# Patient Record
Sex: Male | Born: 1945 | Race: White | Hispanic: No | Smoking: Former smoker
Health system: Southern US, Community
[De-identification: ages and names within clinical notes are randomized; demographics above are authoritative.]

## PROBLEM LIST (undated history)

## (undated) DIAGNOSIS — K219 Gastro-esophageal reflux disease without esophagitis: Secondary | ICD-10-CM

## (undated) DIAGNOSIS — I480 Paroxysmal atrial fibrillation: Secondary | ICD-10-CM

## (undated) DIAGNOSIS — J449 Chronic obstructive pulmonary disease, unspecified: Secondary | ICD-10-CM

## (undated) DIAGNOSIS — E1140 Type 2 diabetes mellitus with diabetic neuropathy, unspecified: Secondary | ICD-10-CM

## (undated) DIAGNOSIS — N183 Chronic kidney disease, stage 3 unspecified: Secondary | ICD-10-CM

## (undated) DIAGNOSIS — D696 Thrombocytopenia, unspecified: Secondary | ICD-10-CM

## (undated) DIAGNOSIS — N179 Acute kidney failure, unspecified: Secondary | ICD-10-CM

## (undated) DIAGNOSIS — M109 Gout, unspecified: Secondary | ICD-10-CM

## (undated) DIAGNOSIS — J90 Pleural effusion, not elsewhere classified: Secondary | ICD-10-CM

## (undated) DIAGNOSIS — E119 Type 2 diabetes mellitus without complications: Secondary | ICD-10-CM

## (undated) DIAGNOSIS — I2781 Cor pulmonale (chronic): Secondary | ICD-10-CM

## (undated) DIAGNOSIS — I2721 Secondary pulmonary arterial hypertension: Secondary | ICD-10-CM

## (undated) DIAGNOSIS — K766 Portal hypertension: Secondary | ICD-10-CM

## (undated) DIAGNOSIS — I10 Essential (primary) hypertension: Secondary | ICD-10-CM

---

## 2016-01-06 ENCOUNTER — Inpatient Hospital Stay
Admission: AD | Admit: 2016-01-06 | Discharge: 2016-01-24 | Disposition: A | Discharge status: Home Health | Payer: Self-pay | Source: Ambulatory Visit | Attending: Internal Medicine | Admitting: Internal Medicine

## 2016-01-06 DIAGNOSIS — J90 Pleural effusion, not elsewhere classified: Secondary | ICD-10-CM

## 2016-01-06 DIAGNOSIS — J9690 Respiratory failure, unspecified, unspecified whether with hypoxia or hypercapnia: Secondary | ICD-10-CM

## 2016-01-06 HISTORY — DX: Pleural effusion, not elsewhere classified: J90

## 2016-01-06 HISTORY — DX: Chronic obstructive pulmonary disease, unspecified: J44.9

## 2016-01-06 HISTORY — DX: Type 2 diabetes mellitus without complications: E11.9

## 2016-01-06 HISTORY — DX: Portal hypertension: K76.6

## 2016-01-06 HISTORY — DX: Morbid (severe) obesity due to excess calories: E66.01

## 2016-01-06 HISTORY — DX: Acute kidney failure, unspecified: N17.9

## 2016-01-06 HISTORY — DX: Secondary pulmonary arterial hypertension: I27.21

## 2016-01-06 HISTORY — DX: Gastro-esophageal reflux disease without esophagitis: K21.9

## 2016-01-06 HISTORY — DX: Type 2 diabetes mellitus with diabetic neuropathy, unspecified: E11.40

## 2016-01-06 HISTORY — DX: Chronic kidney disease, stage 3 (moderate): N18.3

## 2016-01-06 HISTORY — DX: Cor pulmonale (chronic): I27.81

## 2016-01-06 HISTORY — DX: Paroxysmal atrial fibrillation: I48.0

## 2016-01-06 HISTORY — DX: Essential (primary) hypertension: I10

## 2016-01-06 HISTORY — DX: Gout, unspecified: M10.9

## 2016-01-06 HISTORY — DX: Chronic kidney disease, stage 3 unspecified: N18.30

## 2016-01-06 HISTORY — DX: Thrombocytopenia, unspecified: D69.6

## 2016-01-07 ENCOUNTER — Institutional Professional Consult (permissible substitution) (HOSPITAL_COMMUNITY): Payer: Self-pay

## 2016-01-07 LAB — CBC WITH DIFFERENTIAL/PLATELET
BASOS ABS: 0 10*3/uL (ref 0.0–0.1)
Basophils Relative: 0 %
EOS PCT: 2 %
Eosinophils Absolute: 0.1 10*3/uL (ref 0.0–0.7)
HEMATOCRIT: 41.9 % (ref 39.0–52.0)
Hemoglobin: 12.4 g/dL — ABNORMAL LOW (ref 13.0–17.0)
LYMPHS PCT: 21 %
Lymphs Abs: 1.2 10*3/uL (ref 0.7–4.0)
MCH: 28.8 pg (ref 26.0–34.0)
MCHC: 29.6 g/dL — AB (ref 30.0–36.0)
MCV: 97.2 fL (ref 78.0–100.0)
MONO ABS: 0.3 10*3/uL (ref 0.1–1.0)
MONOS PCT: 5 %
NEUTROS ABS: 4.2 10*3/uL (ref 1.7–7.7)
Neutrophils Relative %: 73 %
PLATELETS: 87 10*3/uL — AB (ref 150–400)
RBC: 4.31 MIL/uL (ref 4.22–5.81)
RDW: 16.2 % — AB (ref 11.5–15.5)
WBC: 5.8 10*3/uL (ref 4.0–10.5)

## 2016-01-07 LAB — COMPREHENSIVE METABOLIC PANEL
ALBUMIN: 2.9 g/dL — AB (ref 3.5–5.0)
ALK PHOS: 74 U/L (ref 38–126)
ALT: 24 U/L (ref 17–63)
ANION GAP: 5 (ref 5–15)
AST: 15 U/L (ref 15–41)
BUN: 19 mg/dL (ref 6–20)
CALCIUM: 9.4 mg/dL (ref 8.9–10.3)
CO2: 40 mmol/L — AB (ref 22–32)
Chloride: 95 mmol/L — ABNORMAL LOW (ref 101–111)
Creatinine, Ser: 1.18 mg/dL (ref 0.61–1.24)
GFR calc Af Amer: >60 mL/min (ref >60)
GFR calc non Af Amer: >60 mL/min (ref >60)
GLUCOSE: 115 mg/dL — AB (ref 65–99)
POTASSIUM: 5 mmol/L (ref 3.5–5.1)
SODIUM: 140 mmol/L (ref 135–145)
TOTAL PROTEIN: 5.1 g/dL — AB (ref 6.5–8.1)
Total Bilirubin: 1.2 mg/dL (ref 0.3–1.2)

## 2016-01-07 LAB — PROTIME-INR
INR: 1.3 (ref 0.00–1.49)
Prothrombin Time: 16.3 seconds — ABNORMAL HIGH (ref 11.6–15.2)

## 2016-01-07 LAB — MAGNESIUM: Magnesium: 1.9 mg/dL (ref 1.7–2.4)

## 2016-01-07 LAB — PROCALCITONIN: PROCALCITONIN: 0.21 ng/mL

## 2016-01-07 LAB — BRAIN NATRIURETIC PEPTIDE: B Natriuretic Peptide: 220.4 pg/mL — ABNORMAL HIGH (ref 0.0–100.0)

## 2016-01-07 LAB — TSH: TSH: 3.016 u[IU]/mL (ref 0.350–4.500)

## 2016-01-07 LAB — T4, FREE: Free T4: 1.11 ng/dL (ref 0.61–1.12)

## 2016-01-07 LAB — PHOSPHORUS: Phosphorus: 2.8 mg/dL (ref 2.5–4.6)

## 2016-01-08 LAB — CBC
HEMATOCRIT: 41 % (ref 39.0–52.0)
Hemoglobin: 11.9 g/dL — ABNORMAL LOW (ref 13.0–17.0)
MCH: 28.2 pg (ref 26.0–34.0)
MCHC: 29 g/dL — ABNORMAL LOW (ref 30.0–36.0)
MCV: 97.2 fL (ref 78.0–100.0)
PLATELETS: 95 10*3/uL — AB (ref 150–400)
RBC: 4.22 MIL/uL (ref 4.22–5.81)
RDW: 16.2 % — ABNORMAL HIGH (ref 11.5–15.5)
WBC: 7.9 10*3/uL (ref 4.0–10.5)

## 2016-01-08 LAB — RENAL FUNCTION PANEL
Albumin: 2.8 g/dL — ABNORMAL LOW (ref 3.5–5.0)
Anion gap: 6 (ref 5–15)
BUN: 17 mg/dL (ref 6–20)
CALCIUM: 9.5 mg/dL (ref 8.9–10.3)
CO2: 38 mmol/L — AB (ref 22–32)
CREATININE: 1.14 mg/dL (ref 0.61–1.24)
Chloride: 97 mmol/L — ABNORMAL LOW (ref 101–111)
Glucose, Bld: 112 mg/dL — ABNORMAL HIGH (ref 65–99)
Phosphorus: 3.5 mg/dL (ref 2.5–4.6)
Potassium: 4.8 mmol/L (ref 3.5–5.1)
SODIUM: 141 mmol/L (ref 135–145)

## 2016-01-08 LAB — MAGNESIUM: Magnesium: 1.9 mg/dL (ref 1.7–2.4)

## 2016-01-09 ENCOUNTER — Other Ambulatory Visit (HOSPITAL_COMMUNITY): Payer: Self-pay

## 2016-01-09 LAB — HEMOGLOBIN A1C
HEMOGLOBIN A1C: 7.2 % — AB (ref 4.8–5.6)
MEAN PLASMA GLUCOSE: 160 mg/dL

## 2016-01-10 LAB — RENAL FUNCTION PANEL
Albumin: 3.3 g/dL — ABNORMAL LOW (ref 3.5–5.0)
Anion gap: 8 (ref 5–15)
BUN: 21 mg/dL — AB (ref 6–20)
CALCIUM: 9.8 mg/dL (ref 8.9–10.3)
CO2: 34 mmol/L — AB (ref 22–32)
CREATININE: 1.4 mg/dL — AB (ref 0.61–1.24)
Chloride: 95 mmol/L — ABNORMAL LOW (ref 101–111)
GFR calc Af Amer: 58 mL/min — ABNORMAL LOW (ref >60)
GFR calc non Af Amer: 50 mL/min — ABNORMAL LOW (ref >60)
GLUCOSE: 149 mg/dL — AB (ref 65–99)
Phosphorus: 4.3 mg/dL (ref 2.5–4.6)
Potassium: 4.7 mmol/L (ref 3.5–5.1)
SODIUM: 137 mmol/L (ref 135–145)

## 2016-01-10 LAB — CBC WITH DIFFERENTIAL/PLATELET
BASOS PCT: 0 %
Basophils Absolute: 0 10*3/uL (ref 0.0–0.1)
EOS ABS: 0.1 10*3/uL (ref 0.0–0.7)
Eosinophils Relative: 1 %
HCT: 44.5 % (ref 39.0–52.0)
HEMOGLOBIN: 12.9 g/dL — AB (ref 13.0–17.0)
Lymphocytes Relative: 12 %
Lymphs Abs: 0.9 10*3/uL (ref 0.7–4.0)
MCH: 28.3 pg (ref 26.0–34.0)
MCHC: 29 g/dL — AB (ref 30.0–36.0)
MCV: 97.6 fL (ref 78.0–100.0)
MONO ABS: 0.4 10*3/uL (ref 0.1–1.0)
MONOS PCT: 6 %
NEUTROS PCT: 81 %
Neutro Abs: 5.7 10*3/uL (ref 1.7–7.7)
Platelets: 88 10*3/uL — ABNORMAL LOW (ref 150–400)
RBC: 4.56 MIL/uL (ref 4.22–5.81)
RDW: 16.1 % — AB (ref 11.5–15.5)
WBC: 7.1 10*3/uL (ref 4.0–10.5)

## 2016-01-10 LAB — MAGNESIUM: MAGNESIUM: 1.9 mg/dL (ref 1.7–2.4)

## 2016-01-11 LAB — CBC WITH DIFFERENTIAL/PLATELET
BASOS ABS: 0 10*3/uL (ref 0.0–0.1)
Basophils Relative: 0 %
EOS ABS: 0.1 10*3/uL (ref 0.0–0.7)
Eosinophils Relative: 2 %
HEMATOCRIT: 42.1 % (ref 39.0–52.0)
HEMOGLOBIN: 12.8 g/dL — AB (ref 13.0–17.0)
LYMPHS ABS: 0.9 10*3/uL (ref 0.7–4.0)
Lymphocytes Relative: 14 %
MCH: 29.2 pg (ref 26.0–34.0)
MCHC: 30.4 g/dL (ref 30.0–36.0)
MCV: 96.1 fL (ref 78.0–100.0)
MONO ABS: 0.4 10*3/uL (ref 0.1–1.0)
Monocytes Relative: 7 %
NEUTROS ABS: 5 10*3/uL (ref 1.7–7.7)
Neutrophils Relative %: 77 %
Platelets: 113 10*3/uL — ABNORMAL LOW (ref 150–400)
RBC: 4.38 MIL/uL (ref 4.22–5.81)
RDW: 16 % — ABNORMAL HIGH (ref 11.5–15.5)
WBC: 6.4 10*3/uL (ref 4.0–10.5)

## 2016-01-11 LAB — RENAL FUNCTION PANEL
ALBUMIN: 3.2 g/dL — AB (ref 3.5–5.0)
ANION GAP: 6 (ref 5–15)
BUN: 21 mg/dL — AB (ref 6–20)
CALCIUM: 9.6 mg/dL (ref 8.9–10.3)
CO2: 37 mmol/L — ABNORMAL HIGH (ref 22–32)
Chloride: 94 mmol/L — ABNORMAL LOW (ref 101–111)
Creatinine, Ser: 1.26 mg/dL — ABNORMAL HIGH (ref 0.61–1.24)
GFR calc Af Amer: >60 mL/min (ref >60)
GFR, EST NON AFRICAN AMERICAN: 57 mL/min — AB (ref >60)
GLUCOSE: 145 mg/dL — AB (ref 65–99)
PHOSPHORUS: 3.9 mg/dL (ref 2.5–4.6)
POTASSIUM: 5.1 mmol/L (ref 3.5–5.1)
SODIUM: 137 mmol/L (ref 135–145)

## 2016-01-11 LAB — MAGNESIUM: MAGNESIUM: 1.9 mg/dL (ref 1.7–2.4)

## 2016-01-12 ENCOUNTER — Encounter: Payer: Self-pay | Admitting: Nurse Practitioner

## 2016-01-12 DIAGNOSIS — I480 Paroxysmal atrial fibrillation: Secondary | ICD-10-CM

## 2016-01-12 DIAGNOSIS — Z7901 Long term (current) use of anticoagulants: Secondary | ICD-10-CM

## 2016-01-12 DIAGNOSIS — J438 Other emphysema: Secondary | ICD-10-CM

## 2016-01-12 NOTE — Consult Note (Signed)
Cardiology Consult    Patient ID: Walter Martin MRN: [id number], DOB/AGE: 69-Jun-1947   Admit date: 01/06/2016 Date of Consult: 01/12/2016  Primary Physician: Pcp Not In System Primary Cardiologist: new - seen by M. Anne Fu, MD  Requesting Provider: A. Hijazi, MD  Patient Profile    70 y/o ? with a h/o COPD, HTN, HL, DM2, and chronic resp failure, whom we've been asked to eval 2/2 PAF.  Past Medical History   Past Medical History  Diagnosis Date  . COPD (chronic obstructive pulmonary disease) (HCC)   . PAF (paroxysmal atrial fibrillation) (HCC)     a. Dx 01/2016;  b. CHA2DS2VASc = 3-->Eliquis.  . Acute kidney injury (HCC)     a. 12/2015 in setting of resp failure/poor PO intake.  Marland Kitchen Pleural effusion, right     a. 12/2015 s/p thoracentesis [facility]).  . Essential hypertension   . Cor pulmonale (chronic) (HCC)   . PAH (pulmonary arterial hypertension) with portal hypertension (HCC)     a. 12/2015 Echo [facility]): EF 60%, mod PAH.  Marland Kitchen Thrombocytopenia (HCC)   . Type II diabetes mellitus (HCC)   . GERD (gastroesophageal reflux disease)   . Diabetic neuropathy (HCC)   . Gout   . Morbid obesity (HCC)   . CKD (chronic kidney disease), stage III     No past surgical history on file.   Allergies  Allergies not on file  History of Present Illness    70 year old male with above complex past medical history. Is a long history of tobacco abuse, smoking up to 2 packs a day for approximately 40 years. He also has a history of hypertension, hyperlipidemia, diabetes with neuropathy, stage III chronic kidney disease, obesity, thrombocytopenia, and COPD. He lives by himself in [facility] and is not particularly active. He was admitted to [facility] earlier this spring with COPD requiring steroid taper and adjustment of inhalers. More recently, [facility] hypoxic and hypercarbic respiratory failure in the setting of an acute exacerbation of COPD requiring  BiPAP. He was subsequently transferred to [facility] on the 20th he was treated with antibiotics, steroids, and inhalers. Chest x-ray showed a right-sided pleural effusion with atelectasis and subsequently underwent thoracentesis with removal of 700 mL on May 30. Fluid was found to be transudate without malignancy. Echocardiogram was performed during admission and reportedly showed normal LV function with evidence of pulmonary arterial hypertension and elevated right atrial pressure.  Following thoracentesis, his respiratory status steadily improved though he continued to require oxygen via nasal cannula. On June 2, he went into atrial fibrillation with rapid ventricular response. He was initially treated with IV metoprolol with subsequent conversion to sinus rhythm. He was placed on Elavil was 5 mg twice a day and also oral diltiazem. [facility] Patient has been relatively stable since his admission here and says that his breathing has continued to improve and he continues to have a mild, productive cough. He has not been having any chest pain or palpitations. Beginning in the early morning hours of June 5, he had recurrent atrial fibrillation. This was initially treated with IV diltiazem and subsequently changed to amiodarone infusion on the morning of June 7. His rates have been in the 90s to 130s and he has not been experiencing any chest pain, palpitations, or dyspnea. We have been asked to evaluate.electrolytes and thyroid function have been within normal limits.  Inpatient Medications  Amiodarone at 0.5 mg per minute Humalog a.c. And at bedtime Aspirin 81 mg daily Lipitor 20 mg daily Budesonide 0.5 mg twice a day Daliresp 500 mcg daily Eliquis 5 mg twice a day Flonase 50 mcg twice a day Gabapentin 300 mg 3 times a day Guaifenesin DM 100-10 mg per 5 mL of every 6 hours Incruse Ellipta 62.5 mcg daily Combivent 0.5-2.5 mg 3 times a  day Levaquin 750 mg daily Lidoderm patch 5% daily MiraLAX 17 g daily Protonix 40 mg daily Prednisone 10 mg daily Senna +8.6-52 tabs each bedtime Terazosin and 4 mm daily Theo 24 200 mg daily and and  Family History    Family History  Problem Relation Age of Onset  . Other      no premature CAD    Social History    Social History   Social History  . Marital Status: Widowed    Spouse Name: N/A  . Number of Children: N/A  . Years of Education: N/A   Occupational History  . Not on file.   Social History Main Topics  . Smoking status: Former Smoker -- 2.00 packs/day for 40 years    Types: Cigarettes  . Smokeless tobacco: Not on file     Comment: quit a few years ago.  . Alcohol Use: No  . Drug Use: No  . Sexual Activity: Not on file   Other Topics Concern  . Not on file   Social History Narrative   [place].  Does not routinely exercise.     Review of Systems    General:  No chills, fever, night sweats or weight changes.  Cardiovascular:  No chest pain,  And+++ dyspnea on exertion, +++ edema - more pronounced prior to admission.  No orthopnea, palpitations, paroxysmal nocturnal dyspnea. Dermatological: No rash, lesions/masses Respiratory: +++ cough, +++ dyspnea - improving. Urologic: No hematuria, dysuria Abdominal:   No nausea, vomiting, diarrhea, bright red blood per rectum, melena, or hematemesis Neurologic:  No visual changes, wkns, changes in mental status. All other systems reviewed and are otherwise negative except as noted above.  Physical Exam    temperature 98, heart rate 98, respirations 18, blood pressure 120/69, pulse ox 95% on 2 L General: Pleasant, NAD Psych: Normal affect. Neuro: Alert and oriented X 3. Moves all extremities spontaneously. HEENT: Normal  Neck: Supple, obese, without bruits. Difficult to assess JVP secondary to girth. Lungs:  Resp regular and unlabored, diminished breath sounds bilaterally. Heart:  IRRR no s3, s4, or murmurs. Abdomen: Soft, non-tender, non-distended, BS + x 4.  Extremities: No clubbing, cyanosis or edema. DP/PT/Radials 2+ and equal bilaterally.  Labs   Lab Results  Component Value Date   WBC 6.4 01/11/2016   HGB 12.8* 01/11/2016   HCT 42.1 01/11/2016   MCV 96.1 01/11/2016   PLT 113* 01/11/2016    Recent Labs Lab 01/07/16 0548  01/11/16 0826  NA 140  < > 137  K 5.0  < > 5.1  CL 95*  < > 94*  CO2 40*  < > 37*  BUN 19  < > 21*  CREATININE 1.18  < > 1.26*  CALCIUM 9.4  < > 9.6  PROT 5.1*  --   --   BILITOT 1.2  --   --   ALKPHOS 74  --   --   ALT 24  --   --   AST 15  --   --   GLUCOSE 115*  < >  145*  < > = values in this interval not displayed.   Radiology Studies    Dg Chest Port 1 View  Jan 25, 2016  CLINICAL DATA:  Pleural effusion EXAM: PORTABLE CHEST 1 VIEW COMPARISON:  01/07/2016 FINDINGS: Right pleural effusion is stable. There is no pneumothorax. Right upper lung zone is clear. Minimal subsegmental atelectasis at the left base. There is no pneumothorax. IMPRESSION: Stable right pleural effusion. Electronically Signed   By: Jolaine Click M.D.   On: 01/25/2016 10:13   Dg Chest Port 1 View  01/07/2016  CLINICAL DATA:  Respiratory failure. EXAM: PORTABLE CHEST 1 VIEW COMPARISON:  None. FINDINGS: The cardiac silhouette is partially obscured but is likely mildly enlarged. Thoracic aortic atherosclerosis is noted. There is dense opacity in the right lung base which silhouettes the hemidiaphragm and may reflect a combination of parenchymal consolidation and small pleural effusion. Mild patchy and curvilinear opacity is present in the left lung base. No overt pulmonary edema or pneumothorax identified. Possible 5 mm calcified granuloma in the right lung apex. Cervical fusion hardware partially visualized. Thoracic spondylosis. IMPRESSION: 1. Right basilar opacity which may reflect pneumonia and associated pleural effusion. Followup PA and lateral chest X-ray is  recommended in 3-4 weeks following trial of antibiotic therapy to ensure resolution and exclude underlying malignancy. 2. Mild left basilar opacity which may reflect atelectasis or additional infection. Electronically Signed   By: Sebastian Ache M.D.   On: 01/07/2016 08:48    ECG & Cardiac Imaging    AF, 142, non-specific st/t changes - 01-25-16 1:39 AM  Assessment & Plan    1.  PAF:  Pt presented on tx from [facility] for further mgmt of respiratory failure.  He did have an episode of PAF in [place] and was placed on eliquis and oral dilt.  He maintained sinus rhythm here until the early morning hours of 6/5, when he converted to AF.  He was initially placed on IV dilt without much improvement in rate and he was switched to IV amio yesterday.  Per report, he was on both IV amio and IV dilt for a portion of the day and rates did improve.  He is currently asymptomatic and remains in AF with rates in the 90's to 130's.  We will convert him over to amio 400 bid and add back dilt @ 60 mg q6h.  Hopefully this will aid in rate control and help him to convert.  We can adjust dilt dose and convert to long-acting once rate control achieved.  If necessary, we can consider TEE/DCCV.  We will follow along.  2.  COPD/Chronic resp failure:  Per Medicine.  Signed, Nicolasa Ducking, NP 01/12/2016, 2:47 PM   Personally seen and examined. Agree with above.  70 year old male with chronic COPD, new onset atrial fibrillation with rapid ventricular response, difficult to rate control with use of both diltiazem IV drip as well as amiodarone IV drip, asymptomatic, on anticoagulation.  Distant breath sounds secondary to COPD, heart rate irregular irregular, tachycardic. No appreciable murmurs.  EF was normal per prior echocardiogram.   Recommendation is to convert IV amiodarone to by mouth amiodarone 400 mg twice a day for the next 5 days then 200 mg twice a day for 2 weeks then 200 mg a day  thereafter.  Also, add diltiazem 60 mg by mouth every 6 hours for additional rate control. Hopefully combination of both amiodarone and IV diltiazem will be adequate. He has recently demonstrated sinus rhythm a few days ago.  As his lungs continue to heal, hopefully we will see more evidence of sinus rhythm. If we are unable to achieve adequate rate control, we could consider TEE cardioversion.  We will follow along.  Donato Schultz, MD

## 2016-01-13 LAB — RENAL FUNCTION PANEL
ALBUMIN: 3.2 g/dL — AB (ref 3.5–5.0)
ANION GAP: 8 (ref 5–15)
BUN: 20 mg/dL (ref 6–20)
CO2: 35 mmol/L — ABNORMAL HIGH (ref 22–32)
Calcium: 9.7 mg/dL (ref 8.9–10.3)
Chloride: 95 mmol/L — ABNORMAL LOW (ref 101–111)
Creatinine, Ser: 1.31 mg/dL — ABNORMAL HIGH (ref 0.61–1.24)
GFR calc Af Amer: >60 mL/min (ref >60)
GFR, EST NON AFRICAN AMERICAN: 54 mL/min — AB (ref >60)
Glucose, Bld: 119 mg/dL — ABNORMAL HIGH (ref 65–99)
PHOSPHORUS: 4.2 mg/dL (ref 2.5–4.6)
POTASSIUM: 4.6 mmol/L (ref 3.5–5.1)
Sodium: 138 mmol/L (ref 135–145)

## 2016-01-13 LAB — CBC WITH DIFFERENTIAL/PLATELET
BASOS ABS: 0 10*3/uL (ref 0.0–0.1)
BASOS PCT: 0 %
Eosinophils Absolute: 0.1 10*3/uL (ref 0.0–0.7)
Eosinophils Relative: 1 %
HEMATOCRIT: 43.2 % (ref 39.0–52.0)
HEMOGLOBIN: 12.7 g/dL — AB (ref 13.0–17.0)
LYMPHS PCT: 10 %
Lymphs Abs: 0.7 10*3/uL (ref 0.7–4.0)
MCH: 28.3 pg (ref 26.0–34.0)
MCHC: 29.4 g/dL — ABNORMAL LOW (ref 30.0–36.0)
MCV: 96.2 fL (ref 78.0–100.0)
MONOS PCT: 6 %
Monocytes Absolute: 0.4 10*3/uL (ref 0.1–1.0)
NEUTROS ABS: 5.6 10*3/uL (ref 1.7–7.7)
NEUTROS PCT: 84 %
Platelets: 89 10*3/uL — ABNORMAL LOW (ref 150–400)
RBC: 4.49 MIL/uL (ref 4.22–5.81)
RDW: 15.9 % — ABNORMAL HIGH (ref 11.5–15.5)
WBC: 6.8 10*3/uL (ref 4.0–10.5)

## 2016-01-13 LAB — MAGNESIUM: MAGNESIUM: 1.9 mg/dL (ref 1.7–2.4)

## 2016-01-13 LAB — CK TOTAL AND CKMB (NOT AT ARMC)
CK TOTAL: 30 U/L — AB (ref 49–397)
CK, MB: 3 ng/mL (ref 0.5–5.0)
RELATIVE INDEX: INVALID (ref 0.0–2.5)

## 2016-01-13 LAB — TROPONIN I: Troponin I: 0.04 ng/mL — ABNORMAL HIGH (ref <0.031)

## 2016-01-13 NOTE — Progress Notes (Addendum)
   Cardiologist: Otto HerbNew, Skains Subjective:  Feeling better. Heart rate sounds regular. Quite a bit of artifact on telemetry, likely sinus rhythm 98.  Objective:     Physical Exam: heart rate in the 90s, blood pressure 110 systolic General: Well developed, well nourished, in no acute distress. Head:  Normocephalic and atraumatic. Lungs: Clear to auscultation and percussion.decreased air movement Heart: Normal S1 and S2.  Regular No murmur, rubs or gallops.  Abdomen: soft, non-tender, positive bowel sounds. Extremities: No clubbing or cyanosis. No edema. Neurologic: Alert and oriented x 3.    Lab Results:  Recent Labs  01/11/16 0826 01/13/16 0659  WBC 6.4 6.8  HGB 12.8* 12.7*  PLT 113* 89*    Recent Labs  01/11/16 0826 01/13/16 0659  NA 137 138  K 5.1 4.6  CL 94* 95*  CO2 37* 35*  GLUCOSE 145* 119*  BUN 21* 20  CREATININE 1.26* 1.31*    Recent Labs  01/13/16 0659  TROPONINI 0.04*   Hepatic Function Panel  Recent Labs  01/13/16 0659  ALBUMIN 3.2*     Telemetry: artifact noted on telemetry however heart rate is quite regular, 98 beats per minute noted on pulse oximeter. Likely converted to sinus. Personally viewed.   EKG: prior ECG shows heart rate of 142 beats per minute atrial fibrillation with nonspecific T. Wave changes, 01/09/16 to Personally viewed.  Cardiac Studies:  Echocardiogram previously normal ejection fraction  Meds: Scheduled Meds: Continuous Infusions: PRN Meds:.  Assessment/Plan:   70 year old male with prolonged hospitalization COPD with chronic respiratory failure with paroxysmal atrial fibrillation.  Paroxysmal atrial fibrillation  - Yesterday started diltiazem 60 mg every 6 hours.  - We also transitioned over to amiodarone 400 mg twice a day.  - he currently appears to be regular heart rate of 90 beats per minute, likely sinus rhythm. Quite a bit of artifact however on telemetry. Nonetheless, his heart rate is  improved.  Recommendation is to consolidate his diltiazem to diltiazem CD 240 mg once a day Continue amiodarone 400 mg twice a day for 4 more days and then 200 mg twice a day for 2 weeks and then 200 mg a day thereafter.  Chronic anticoagulation  - Continue with Eliquis 5 BID.   - Would stop ASA 81  I will be happy to see him back in clinic [facility]  We will sign off. Please let us know we can be of further assistance.  Donato SchultzMark Skains, MD    Donato SchultzMark Skains 01/13/2016, 10:27 AM

## 2016-01-14 LAB — RENAL FUNCTION PANEL
ALBUMIN: 3 g/dL — AB (ref 3.5–5.0)
Anion gap: 7 (ref 5–15)
BUN: 23 mg/dL — ABNORMAL HIGH (ref 6–20)
CHLORIDE: 94 mmol/L — AB (ref 101–111)
CO2: 37 mmol/L — ABNORMAL HIGH (ref 22–32)
Calcium: 9.6 mg/dL (ref 8.9–10.3)
Creatinine, Ser: 1.38 mg/dL — ABNORMAL HIGH (ref 0.61–1.24)
GFR, EST AFRICAN AMERICAN: 59 mL/min — AB (ref >60)
GFR, EST NON AFRICAN AMERICAN: 51 mL/min — AB (ref >60)
Glucose, Bld: 122 mg/dL — ABNORMAL HIGH (ref 65–99)
PHOSPHORUS: 3.3 mg/dL (ref 2.5–4.6)
POTASSIUM: 4.7 mmol/L (ref 3.5–5.1)
Sodium: 138 mmol/L (ref 135–145)

## 2016-01-14 LAB — CBC
HEMATOCRIT: 40.7 % (ref 39.0–52.0)
HEMOGLOBIN: 12.1 g/dL — AB (ref 13.0–17.0)
MCH: 28.2 pg (ref 26.0–34.0)
MCHC: 29.7 g/dL — ABNORMAL LOW (ref 30.0–36.0)
MCV: 94.9 fL (ref 78.0–100.0)
Platelets: 95 10*3/uL — ABNORMAL LOW (ref 150–400)
RBC: 4.29 MIL/uL (ref 4.22–5.81)
RDW: 15.9 % — ABNORMAL HIGH (ref 11.5–15.5)
WBC: 5.9 10*3/uL (ref 4.0–10.5)

## 2016-01-15 LAB — CBC
HEMATOCRIT: 41 % (ref 39.0–52.0)
HEMOGLOBIN: 12 g/dL — AB (ref 13.0–17.0)
MCH: 28.1 pg (ref 26.0–34.0)
MCHC: 29.3 g/dL — AB (ref 30.0–36.0)
MCV: 96 fL (ref 78.0–100.0)
Platelets: 89 10*3/uL — ABNORMAL LOW (ref 150–400)
RBC: 4.27 MIL/uL (ref 4.22–5.81)
RDW: 15.9 % — ABNORMAL HIGH (ref 11.5–15.5)
WBC: 5.4 10*3/uL (ref 4.0–10.5)

## 2016-01-16 LAB — RENAL FUNCTION PANEL
Albumin: 3.1 g/dL — ABNORMAL LOW (ref 3.5–5.0)
Anion gap: 8 (ref 5–15)
BUN: 18 mg/dL (ref 6–20)
CHLORIDE: 98 mmol/L — AB (ref 101–111)
CO2: 32 mmol/L (ref 22–32)
Calcium: 9.3 mg/dL (ref 8.9–10.3)
Creatinine, Ser: 1.27 mg/dL — ABNORMAL HIGH (ref 0.61–1.24)
GFR, EST NON AFRICAN AMERICAN: 56 mL/min — AB (ref >60)
Glucose, Bld: 115 mg/dL — ABNORMAL HIGH (ref 65–99)
PHOSPHORUS: 4.3 mg/dL (ref 2.5–4.6)
POTASSIUM: 4.9 mmol/L (ref 3.5–5.1)
Sodium: 138 mmol/L (ref 135–145)

## 2016-01-16 LAB — CBC
HEMATOCRIT: 41.8 % (ref 39.0–52.0)
HEMOGLOBIN: 12.2 g/dL — AB (ref 13.0–17.0)
MCH: 28 pg (ref 26.0–34.0)
MCHC: 29.2 g/dL — ABNORMAL LOW (ref 30.0–36.0)
MCV: 96.1 fL (ref 78.0–100.0)
Platelets: 80 10*3/uL — ABNORMAL LOW (ref 150–400)
RBC: 4.35 MIL/uL (ref 4.22–5.81)
RDW: 15.7 % — ABNORMAL HIGH (ref 11.5–15.5)
WBC: 5.7 10*3/uL (ref 4.0–10.5)

## 2016-01-17 LAB — HEMOGLOBIN A1C
HEMOGLOBIN A1C: 7 % — AB (ref 4.8–5.6)
MEAN PLASMA GLUCOSE: 154 mg/dL

## 2016-01-18 LAB — CBC WITH DIFFERENTIAL/PLATELET
Basophils Absolute: 0 10*3/uL (ref 0.0–0.1)
Basophils Relative: 0 %
EOS PCT: 1 %
Eosinophils Absolute: 0.1 10*3/uL (ref 0.0–0.7)
HCT: 44.9 % (ref 39.0–52.0)
HEMOGLOBIN: 12.8 g/dL — AB (ref 13.0–17.0)
LYMPHS ABS: 0.8 10*3/uL (ref 0.7–4.0)
LYMPHS PCT: 12 %
MCH: 27.9 pg (ref 26.0–34.0)
MCHC: 28.5 g/dL — AB (ref 30.0–36.0)
MCV: 97.8 fL (ref 78.0–100.0)
MONOS PCT: 6 %
Monocytes Absolute: 0.4 10*3/uL (ref 0.1–1.0)
Neutro Abs: 5.1 10*3/uL (ref 1.7–7.7)
Neutrophils Relative %: 80 %
PLATELETS: 85 10*3/uL — AB (ref 150–400)
RBC: 4.59 MIL/uL (ref 4.22–5.81)
RDW: 15.8 % — ABNORMAL HIGH (ref 11.5–15.5)
WBC: 6.4 10*3/uL (ref 4.0–10.5)

## 2016-01-18 LAB — RENAL FUNCTION PANEL
ANION GAP: 5 (ref 5–15)
Albumin: 3.5 g/dL (ref 3.5–5.0)
BUN: 21 mg/dL — ABNORMAL HIGH (ref 6–20)
CHLORIDE: 97 mmol/L — AB (ref 101–111)
CO2: 37 mmol/L — AB (ref 22–32)
Calcium: 9.6 mg/dL (ref 8.9–10.3)
Creatinine, Ser: 1.25 mg/dL — ABNORMAL HIGH (ref 0.61–1.24)
GFR calc non Af Amer: 57 mL/min — ABNORMAL LOW (ref >60)
Glucose, Bld: 131 mg/dL — ABNORMAL HIGH (ref 65–99)
Phosphorus: 3.8 mg/dL (ref 2.5–4.6)
Potassium: 4.5 mmol/L (ref 3.5–5.1)
Sodium: 139 mmol/L (ref 135–145)

## 2016-01-18 LAB — MAGNESIUM: Magnesium: 2 mg/dL (ref 1.7–2.4)

## 2016-01-20 LAB — RENAL FUNCTION PANEL
ANION GAP: 1 — AB (ref 5–15)
Albumin: 3.1 g/dL — ABNORMAL LOW (ref 3.5–5.0)
BUN: 17 mg/dL (ref 6–20)
CALCIUM: 9.5 mg/dL (ref 8.9–10.3)
CHLORIDE: 98 mmol/L — AB (ref 101–111)
CO2: 42 mmol/L — AB (ref 22–32)
Creatinine, Ser: 1.19 mg/dL (ref 0.61–1.24)
GFR calc non Af Amer: >60 mL/min (ref >60)
Glucose, Bld: 118 mg/dL — ABNORMAL HIGH (ref 65–99)
POTASSIUM: 4.5 mmol/L (ref 3.5–5.1)
Phosphorus: 4 mg/dL (ref 2.5–4.6)
Sodium: 141 mmol/L (ref 135–145)

## 2016-01-20 LAB — CBC WITH DIFFERENTIAL/PLATELET
Basophils Absolute: 0 10*3/uL (ref 0.0–0.1)
Basophils Relative: 0 %
Eosinophils Absolute: 0.1 10*3/uL (ref 0.0–0.7)
Eosinophils Relative: 1 %
HEMATOCRIT: 42.2 % (ref 39.0–52.0)
HEMOGLOBIN: 12.1 g/dL — AB (ref 13.0–17.0)
LYMPHS ABS: 0.6 10*3/uL — AB (ref 0.7–4.0)
LYMPHS PCT: 11 %
MCH: 27.9 pg (ref 26.0–34.0)
MCHC: 28.7 g/dL — AB (ref 30.0–36.0)
MCV: 97.5 fL (ref 78.0–100.0)
MONOS PCT: 8 %
Monocytes Absolute: 0.5 10*3/uL (ref 0.1–1.0)
NEUTROS PCT: 80 %
Neutro Abs: 4.8 10*3/uL (ref 1.7–7.7)
Platelets: 94 10*3/uL — ABNORMAL LOW (ref 150–400)
RBC: 4.33 MIL/uL (ref 4.22–5.81)
RDW: 15.5 % (ref 11.5–15.5)
WBC: 6 10*3/uL (ref 4.0–10.5)

## 2016-01-20 LAB — MAGNESIUM: MAGNESIUM: 2.1 mg/dL (ref 1.7–2.4)

## 2016-01-23 LAB — CBC WITH DIFFERENTIAL/PLATELET
BASOS ABS: 0 10*3/uL (ref 0.0–0.1)
BASOS PCT: 0 %
EOS ABS: 0.1 10*3/uL (ref 0.0–0.7)
EOS PCT: 2 %
HCT: 40.5 % (ref 39.0–52.0)
Hemoglobin: 11.6 g/dL — ABNORMAL LOW (ref 13.0–17.0)
Lymphocytes Relative: 18 %
Lymphs Abs: 0.9 10*3/uL (ref 0.7–4.0)
MCH: 27.8 pg (ref 26.0–34.0)
MCHC: 28.6 g/dL — ABNORMAL LOW (ref 30.0–36.0)
MCV: 97.1 fL (ref 78.0–100.0)
MONO ABS: 0.4 10*3/uL (ref 0.1–1.0)
Monocytes Relative: 8 %
NEUTROS ABS: 3.5 10*3/uL (ref 1.7–7.7)
Neutrophils Relative %: 72 %
PLATELETS: 126 10*3/uL — AB (ref 150–400)
RBC: 4.17 MIL/uL — ABNORMAL LOW (ref 4.22–5.81)
RDW: 15.1 % (ref 11.5–15.5)
WBC: 4.8 10*3/uL (ref 4.0–10.5)

## 2016-01-23 LAB — RENAL FUNCTION PANEL
ALBUMIN: 2.9 g/dL — AB (ref 3.5–5.0)
Anion gap: 5 (ref 5–15)
BUN: 18 mg/dL (ref 6–20)
CO2: 40 mmol/L — ABNORMAL HIGH (ref 22–32)
CREATININE: 1.18 mg/dL (ref 0.61–1.24)
Calcium: 9.1 mg/dL (ref 8.9–10.3)
Chloride: 94 mmol/L — ABNORMAL LOW (ref 101–111)
GFR calc Af Amer: >60 mL/min (ref >60)
GLUCOSE: 105 mg/dL — AB (ref 65–99)
PHOSPHORUS: 2.9 mg/dL (ref 2.5–4.6)
Potassium: 4.4 mmol/L (ref 3.5–5.1)
Sodium: 139 mmol/L (ref 135–145)

## 2016-01-23 LAB — MAGNESIUM: MAGNESIUM: 1.8 mg/dL (ref 1.7–2.4)

## 2016-03-06 DEATH — deceased

## 2017-11-12 IMAGING — CR DG CHEST 1V PORT
1 series · 1 of 1 positions shown · non-contrast
Comparison: None.

CLINICAL DATA: Respiratory failure.

EXAM:
PORTABLE CHEST 1 VIEW

[AP]
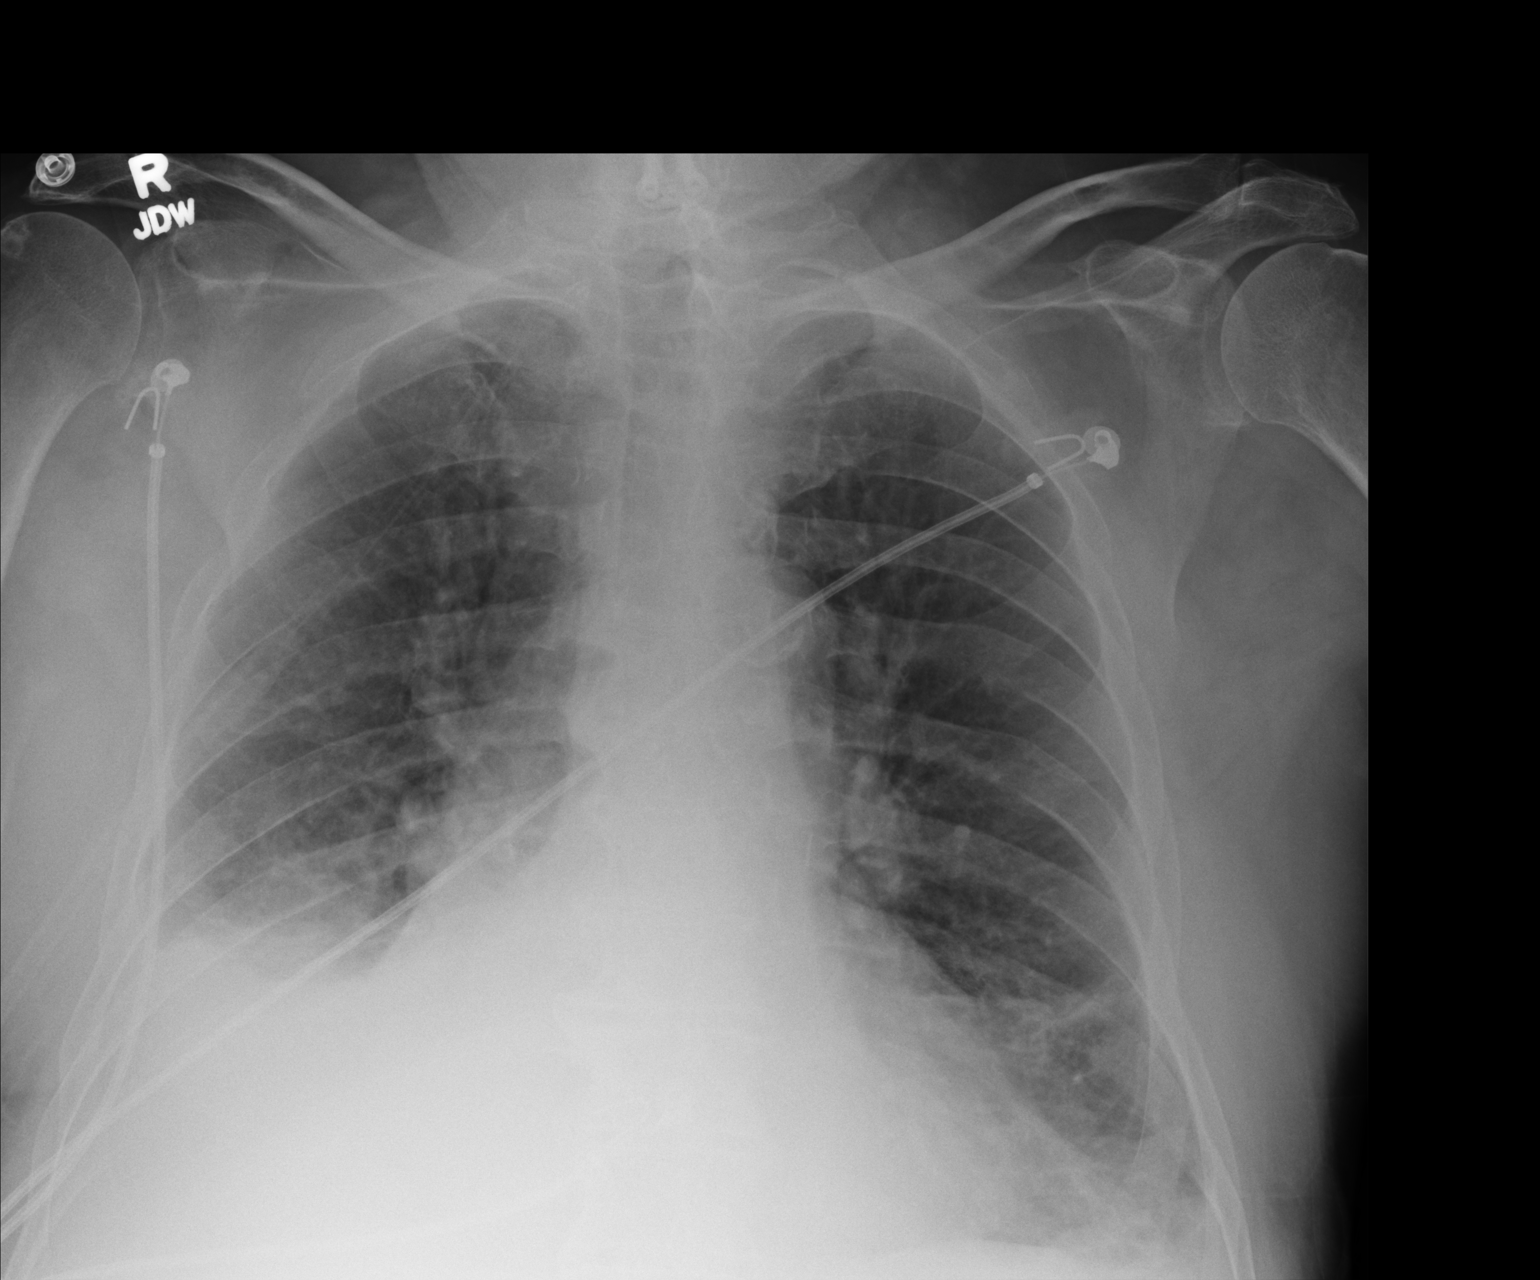

[1 of 1 positions shown; findings below may reference images not displayed]

FINDINGS: The cardiac silhouette is partially obscured but is likely mildly
enlarged. Thoracic aortic atherosclerosis is noted. There is dense
opacity in the right lung base which silhouettes the hemidiaphragm
and may reflect a combination of parenchymal consolidation and small
pleural effusion. Mild patchy and curvilinear opacity is present in
the left lung base. No overt pulmonary edema or pneumothorax
identified. Possible 5 mm calcified granuloma in the right lung
apex. Cervical fusion hardware partially visualized. Thoracic
spondylosis.
IMPRESSION: 1. Right basilar opacity which may reflect pneumonia and associated
pleural effusion. Followup PA and lateral chest X-ray is recommended
in 3-4 weeks following trial of antibiotic therapy to ensure
resolution and exclude underlying malignancy.
2. Mild left basilar opacity which may reflect atelectasis or
additional infection.
